# Patient Record
Sex: Female | Born: 1975 | Race: Black or African American | Hispanic: No | Marital: Single | Smoking: Current every day smoker
Health system: Southern US, Community
[De-identification: ages and names within clinical notes are randomized; demographics above are authoritative.]

## PROBLEM LIST (undated history)

## (undated) DIAGNOSIS — I10 Essential (primary) hypertension: Secondary | ICD-10-CM

## (undated) HISTORY — PX: DILATION AND CURETTAGE OF UTERUS: SHX78

## (undated) HISTORY — PX: BREAST BIOPSY: SHX20

## (undated) HISTORY — PX: RENAL BIOPSY: SHX156

---

## 2014-04-03 ENCOUNTER — Encounter (HOSPITAL_BASED_OUTPATIENT_CLINIC_OR_DEPARTMENT_OTHER): Payer: Self-pay | Admitting: *Deleted

## 2014-04-03 ENCOUNTER — Emergency Department (HOSPITAL_BASED_OUTPATIENT_CLINIC_OR_DEPARTMENT_OTHER)
Admission: EM | Admit: 2014-04-03 | Discharge: 2014-04-03 | Disposition: A | Discharge status: Home / Self Care | Payer: No Typology Code available for payment source | Attending: Emergency Medicine | Admitting: Emergency Medicine

## 2014-04-03 ENCOUNTER — Emergency Department (HOSPITAL_BASED_OUTPATIENT_CLINIC_OR_DEPARTMENT_OTHER): Payer: No Typology Code available for payment source

## 2014-04-03 DIAGNOSIS — S8002XA Contusion of left knee, initial encounter: Secondary | ICD-10-CM | POA: Insufficient documentation

## 2014-04-03 DIAGNOSIS — Y9289 Other specified places as the place of occurrence of the external cause: Secondary | ICD-10-CM | POA: Insufficient documentation

## 2014-04-03 DIAGNOSIS — Y998 Other external cause status: Secondary | ICD-10-CM | POA: Insufficient documentation

## 2014-04-03 DIAGNOSIS — Z720 Tobacco use: Secondary | ICD-10-CM | POA: Insufficient documentation

## 2014-04-03 DIAGNOSIS — I10 Essential (primary) hypertension: Secondary | ICD-10-CM | POA: Insufficient documentation

## 2014-04-03 DIAGNOSIS — Y9389 Activity, other specified: Secondary | ICD-10-CM | POA: Insufficient documentation

## 2014-04-03 HISTORY — DX: Essential (primary) hypertension: I10

## 2014-04-03 MED ORDER — ACETAMINOPHEN 500 MG PO TABS
1000.0000 mg | ORAL_TABLET | Freq: Once | ORAL | Status: AC
  Administered 2014-04-03: 1000 mg via ORAL
  Filled 2014-04-03: qty 2
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
## 2014-04-03 NOTE — Discharge Instructions (Signed)

## 2014-04-03 NOTE — ED Notes (Signed)
Went to retrieve patient for x-ray exam. Patient wanted to talk to physician first before going for x-ray. Alerted Dr. Loretha StaplerWofford and he said to place exam on hold for now.
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
## 2017-08-23 ENCOUNTER — Encounter (HOSPITAL_COMMUNITY): Payer: Self-pay | Admitting: Emergency Medicine

## 2017-08-23 ENCOUNTER — Other Ambulatory Visit: Payer: Self-pay

## 2017-08-23 ENCOUNTER — Emergency Department (HOSPITAL_COMMUNITY): Payer: Self-pay

## 2017-08-23 ENCOUNTER — Emergency Department (HOSPITAL_COMMUNITY)
Admission: EM | Admit: 2017-08-23 | Discharge: 2017-08-23 | Discharge status: Against Medical Advice | Payer: Self-pay | Attending: Emergency Medicine | Admitting: Emergency Medicine

## 2017-08-23 DIAGNOSIS — R42 Dizziness and giddiness: Secondary | ICD-10-CM | POA: Insufficient documentation

## 2017-08-23 DIAGNOSIS — I10 Essential (primary) hypertension: Secondary | ICD-10-CM | POA: Insufficient documentation

## 2017-08-23 DIAGNOSIS — F17210 Nicotine dependence, cigarettes, uncomplicated: Secondary | ICD-10-CM | POA: Insufficient documentation

## 2017-08-23 LAB — BASIC METABOLIC PANEL
Anion gap: 10 (ref 5–15)
BUN: 9 mg/dL (ref 6–20)
CALCIUM: 9 mg/dL (ref 8.9–10.3)
CO2: 25 mmol/L (ref 22–32)
Chloride: 104 mmol/L (ref 101–111)
Creatinine, Ser: 0.62 mg/dL (ref 0.44–1.00)
GFR calc Af Amer: >60 mL/min (ref >60)
GFR calc non Af Amer: >60 mL/min (ref >60)
GLUCOSE: 101 mg/dL — AB (ref 65–99)
Potassium: 3.7 mmol/L (ref 3.5–5.1)
Sodium: 139 mmol/L (ref 135–145)

## 2017-08-23 LAB — CBC
HCT: 42.3 % (ref 36.0–46.0)
Hemoglobin: 14 g/dL (ref 12.0–15.0)
MCH: 32 pg (ref 26.0–34.0)
MCHC: 33.1 g/dL (ref 30.0–36.0)
MCV: 96.8 fL (ref 78.0–100.0)
PLATELETS: 370 10*3/uL (ref 150–400)
RBC: 4.37 MIL/uL (ref 3.87–5.11)
RDW: 13.7 % (ref 11.5–15.5)
WBC: 10.6 10*3/uL — ABNORMAL HIGH (ref 4.0–10.5)

## 2017-08-23 LAB — DIFFERENTIAL
Basophils Absolute: 0 10*3/uL (ref 0.0–0.1)
Basophils Relative: 0 %
EOS PCT: 1 %
Eosinophils Absolute: 0.1 10*3/uL (ref 0.0–0.7)
Lymphocytes Relative: 17 %
Lymphs Abs: 1.8 10*3/uL (ref 0.7–4.0)
MONO ABS: 0.8 10*3/uL (ref 0.1–1.0)
Monocytes Relative: 7 %
NEUTROS PCT: 75 %
Neutro Abs: 7.9 10*3/uL — ABNORMAL HIGH (ref 1.7–7.7)

## 2017-08-23 LAB — URINALYSIS, ROUTINE W REFLEX MICROSCOPIC
Bacteria, UA: NONE SEEN
Bilirubin Urine: NEGATIVE
Glucose, UA: NEGATIVE mg/dL
Hgb urine dipstick: NEGATIVE
KETONES UR: NEGATIVE mg/dL
Leukocytes, UA: NEGATIVE
Nitrite: NEGATIVE
PH: 8 (ref 5.0–8.0)
Protein, ur: 30 mg/dL — AB
SQUAMOUS EPITHELIAL / LPF: NONE SEEN
Specific Gravity, Urine: 1.015 (ref 1.005–1.030)

## 2017-08-23 LAB — HEPATIC FUNCTION PANEL
ALT: 16 U/L (ref 14–54)
AST: 23 U/L (ref 15–41)
Albumin: 4.1 g/dL (ref 3.5–5.0)
Alkaline Phosphatase: 66 U/L (ref 38–126)
Bilirubin, Direct: <0.1 mg/dL — ABNORMAL LOW (ref 0.1–0.5)
TOTAL PROTEIN: 8.3 g/dL — AB (ref 6.5–8.1)
Total Bilirubin: 0.8 mg/dL (ref 0.3–1.2)

## 2017-08-23 LAB — LIPASE, BLOOD: Lipase: 25 U/L (ref 11–51)

## 2017-08-23 LAB — CBG MONITORING, ED: Glucose-Capillary: 97 mg/dL (ref 65–99)

## 2017-08-23 LAB — TROPONIN I: Troponin I: <0.03 ng/mL (ref <0.03)

## 2017-08-23 LAB — PREGNANCY, URINE: PREG TEST UR: NEGATIVE

## 2017-08-23 MED ORDER — MECLIZINE HCL 12.5 MG PO TABS
50.0000 mg | ORAL_TABLET | Freq: Once | ORAL | Status: AC
  Administered 2017-08-23: 50 mg via ORAL
  Filled 2017-08-23: qty 4

## 2017-08-23 NOTE — ED Triage Notes (Signed)
Patient c/o sudden dizziness that started approx an hour and a half ago. Per patient nausea and vomiting. Patient states dizziness worse with standing, "feels like room is spinning." Patient reports checking blood pressure at work - 206/126. Denies any weakness, facial drooping, or slurred speech. No neurological deficits noted. Per patient slight headache.

## 2017-08-23 NOTE — Discharge Instructions (Addendum)
Take your usual prescriptions as previously directed. Take your blood pressure once per day, a few days per week, either in the morning or in the evening before you go to bed.  Always sit quietly for at least 15 minutes before taking your blood pressure.  Keep a diary of your blood pressures to show your doctor at your follow up office visit.  Call your regular medical doctor today to schedule a follow up appointment in the next 2 days.  Return to the Emergency Department immediately sooner if worsening.

## 2017-08-23 NOTE — ED Provider Notes (Signed)
[facility] EMERGENCY DEPARTMENT Provider Note   CSN: [id number] Arrival date & time: 08/23/17  1146     History   Chief Complaint Chief Complaint  Patient presents with  . Dizziness    HPI Alyssa Thomas is a 42 y.o. female.  HPI Pt was seen at 1250.  Per pt, c/o sudden onset and resolution of one episode of "dizziness" that occurred PTA. Pt describes her symptoms as "everything was spinning." Was associated with N/V. Pt states she went to have her BP checked at work and it was "206/126." Pt states she "feels ok now." Denies CP/SOB, no abd pain, no diarrhea, no black or blood in stools or emesis, no fevers, no neck pain, no injury, no visual changes, no focal motor weakness, no tingling/numbness in extremities, no ataxia, no slurred speech, no facial droop.    Past Medical History:  Diagnosis Date  . Hypertension     There are no active problems to display for this patient.   Past Surgical History:  Procedure Laterality Date  . DILATION AND CURETTAGE OF UTERUS    . RENAL BIOPSY       OB History    Gravida  1   Para      Term      Preterm      AB  1   Living  0     SAB      TAB      Ectopic  1   Multiple      Live Births               Home Medications    Prior to Admission medications   Not on File    Family History Family History  Problem Relation Age of Onset  . Diabetes Mother   . Rheum arthritis Mother   . Hypertension Mother   . Cancer Father     Social History Social History   Tobacco Use  . Smoking status: Current Every Day Smoker    Packs/day: 1.00    Years: 15.00    Pack years: 15.00    Types: Cigarettes  . Smokeless tobacco: Never Used  Substance Use Topics  . Alcohol use: Yes    Comment: occasional  . Drug use: No     Allergies   Sulfa antibiotics   Review of Systems Review of Systems ROS: Statement: All systems negative except as marked or noted in the HPI; Constitutional: Negative for fever and  chills. ; ; Eyes: Negative for eye pain, redness and discharge. ; ; ENMT: Negative for ear pain, hoarseness, nasal congestion, sinus pressure and sore throat. ; ; Cardiovascular: Negative for chest pain, palpitations, diaphoresis, dyspnea and peripheral edema. ; ; Respiratory: Negative for cough, wheezing and stridor. ; ; Gastrointestinal: +N/V. Negative for diarrhea, abdominal pain, blood in stool, hematemesis, jaundice and rectal bleeding. . ; ; Genitourinary: Negative for dysuria, flank pain and hematuria. ; ; Musculoskeletal: Negative for back pain and neck pain. Negative for swelling and trauma.; ; Skin: Negative for pruritus, rash, abrasions, blisters, bruising and skin lesion.; ; Neuro: +"spinning." Negative for headache, lightheadedness and neck stiffness. Negative for weakness, altered level of consciousness, altered mental status, extremity weakness, paresthesias, involuntary movement, seizure and syncope.       Physical Exam Updated Vital Signs BP (!) 190/107   Pulse 85   Temp 98.9 F (37.2 C)   Resp 15   Ht 5\' 6"  (1.676 m)   Wt 86.2 kg (190 lb)  LMP 08/23/2017   SpO2 97%   BMI 30.67 kg/m   BP (!) 195/92 (BP Location: Left Arm)   Pulse 95   Temp 98.9 F (37.2 C)   Resp 16   Ht 5\' 6"  (1.676 m)   Wt 86.2 kg (190 lb)   LMP 08/23/2017   SpO2 98%   BMI 30.67 kg/m   Physical Exam 1255: Physical examination:  Nursing notes reviewed; Vital signs and O2 SAT reviewed;  Constitutional: Well developed, Well nourished, Well hydrated, In no acute distress; Head:  Normocephalic, atraumatic; Eyes: EOMI, PERRL, No scleral icterus; ENMT: TM's clear bilat. +edemetous nasal turbinates bilat with clear rhinorrhea. Mouth and pharynx normal, Mucous membranes moist; Neck: Supple, Full range of motion, No lymphadenopathy; Cardiovascular: Regular rate and rhythm, No gallop; Respiratory: Breath sounds clear & equal bilaterally, No wheezes.  Speaking full sentences with ease, Normal respiratory  effort/excursion; Chest: Nontender, Movement normal; Abdomen: Soft, Nontender, Nondistended, Normal bowel sounds; Genitourinary: No CVA tenderness; Extremities: Peripheral pulses normal, No tenderness, No edema, No calf edema or asymmetry.; Neuro: AA&Ox3, Major CN grossly intact. Speech clear.  No facial droop.  No nystagmus. Grips equal. Strength 5/5 equal bilat UE's and LE's.  DTR 2/4 equal bilat UE's and LE's.  No gross sensory deficits.  Normal cerebellar testing bilat UE's (finger-nose) and LE's (heel-shin)..; Skin: Color normal, Warm, Dry.   ED Treatments / Results  Labs (all labs ordered are listed, but only abnormal results are displayed)   EKG EKG Interpretation  Date/Time:  Thursday August 23 2017 12:10:54 EDT Ventricular Rate:  90 PR Interval:    QRS Duration: 158 QT Interval:  443 QTC Calculation: 543 R Axis:   -39 Text Interpretation:  Sinus rhythm Biatrial enlargement Left bundle branch block No previous ECGs available Confirmed by Vanetta Mulders [phone]) on 08/23/2017 12:14:35 PM   Radiology   Procedures Procedures (including critical care time)  Medications Ordered in ED Medications  meclizine (ANTIVERT) tablet 50 mg (50 mg Oral Given 08/23/17 1301)     Initial Impression / Assessment and Plan / ED Course  I have reviewed the triage vital signs and the nursing notes.  Pertinent labs & imaging results that were available during my care of the patient were reviewed by me and considered in my medical decision making (see chart for details).  MDM Reviewed: previous chart, vitals and nursing note Reviewed previous: labs and ECG Interpretation: labs and ECG   Results for orders placed or performed during the hospital encounter of 08/23/17  Basic metabolic panel  Result Value Ref Range   Sodium 139 135 - 145 mmol/L   Potassium 3.7 3.5 - 5.1 mmol/L   Chloride 104 101 - 111 mmol/L   CO2 25 22 - 32 mmol/L   Glucose, Bld 101 (H) 65 - 99 mg/dL   BUN 9 6 - 20 mg/dL     Creatinine, Ser 4.09 0.44 - 1.00 mg/dL   Calcium 9.0 8.9 - 81.1 mg/dL   GFR calc non Af Amer >60 >60 mL/min   GFR calc Af Amer >60 >60 mL/min   Anion gap 10 5 - 15  CBC  Result Value Ref Range   WBC 10.6 (H) 4.0 - 10.5 K/uL   RBC 4.37 3.87 - 5.11 MIL/uL   Hemoglobin 14.0 12.0 - 15.0 g/dL   HCT 91.4 78.2 - 95.6 %   MCV 96.8 78.0 - 100.0 fL   MCH 32.0 26.0 - 34.0 pg   MCHC 33.1 30.0 - 36.0 g/dL  RDW 13.7 11.5 - 15.5 %   Platelets 370 150 - 400 K/uL  Pregnancy, urine  Result Value Ref Range   Preg Test, Ur NEGATIVE NEGATIVE  Differential  Result Value Ref Range   Neutrophils Relative % 75 %   Neutro Abs 7.9 (H) 1.7 - 7.7 K/uL   Lymphocytes Relative 17 %   Lymphs Abs 1.8 0.7 - 4.0 K/uL   Monocytes Relative 7 %   Monocytes Absolute 0.8 0.1 - 1.0 K/uL   Eosinophils Relative 1 %   Eosinophils Absolute 0.1 0.0 - 0.7 K/uL   Basophils Relative 0 %   Basophils Absolute 0.0 0.0 - 0.1 K/uL  CBG monitoring, ED  Result Value Ref Range   Glucose-Capillary 97 65 - 99 mg/dL    5643:  BP's per previous on file. Pt states she has metal clips in her hair and is not going to remove them for any imaging study. Risks/benefits/reasoning of imaging study discussed with pt, pt verb understanding and refuses to take hair clips out. Pt states she "doesn't think I had a stroke and don't need all of this stuff done." Pt then stated she wanted to leave now.  I encouraged pt to stay, continues to refuse.  Pt makes her own medical decisions.  Risks of AMA explained to pt, including, but not limited to:  stroke, heart attack, cardiac arrythmia ("irregular heart rate/beat"), "passing out," temporary and/or permanent disability, death.  Pt verb understanding and continue to refuse any further testing in the ED, understanding the consequences of her decision.  I encouraged pt to follow up with her PMD tomorrow and return to the ED immediately if symptoms return, or for any other concerns.  Pt verb understanding,  agreeable.    Final Clinical Impressions(s) / ED Diagnoses   Final diagnoses:  None    ED Discharge Orders    None       Samuel Jester, DO 08/27/17 0800

## 2017-11-20 DIAGNOSIS — I10 Essential (primary) hypertension: Secondary | ICD-10-CM | POA: Diagnosis not present

## 2017-11-20 DIAGNOSIS — F17200 Nicotine dependence, unspecified, uncomplicated: Secondary | ICD-10-CM | POA: Diagnosis not present

## 2017-11-20 DIAGNOSIS — Z1389 Encounter for screening for other disorder: Secondary | ICD-10-CM | POA: Diagnosis not present

## 2017-11-20 DIAGNOSIS — Z6831 Body mass index (BMI) 31.0-31.9, adult: Secondary | ICD-10-CM | POA: Diagnosis not present

## 2017-11-20 DIAGNOSIS — R9431 Abnormal electrocardiogram [ECG] [EKG]: Secondary | ICD-10-CM | POA: Diagnosis not present

## 2017-11-30 ENCOUNTER — Telehealth: Payer: Self-pay

## 2017-11-30 NOTE — Telephone Encounter (Signed)
SENT REFERRAL TO SCHEDULING AND FILED NOTES 

## 2017-12-25 DIAGNOSIS — R0683 Snoring: Secondary | ICD-10-CM | POA: Diagnosis not present

## 2017-12-25 DIAGNOSIS — R011 Cardiac murmur, unspecified: Secondary | ICD-10-CM | POA: Diagnosis not present

## 2017-12-25 DIAGNOSIS — Z720 Tobacco use: Secondary | ICD-10-CM | POA: Diagnosis not present

## 2017-12-25 DIAGNOSIS — I10 Essential (primary) hypertension: Secondary | ICD-10-CM | POA: Diagnosis not present

## 2017-12-25 DIAGNOSIS — Z124 Encounter for screening for malignant neoplasm of cervix: Secondary | ICD-10-CM | POA: Diagnosis not present

## 2017-12-25 DIAGNOSIS — Z0001 Encounter for general adult medical examination with abnormal findings: Secondary | ICD-10-CM | POA: Diagnosis not present

## 2017-12-25 DIAGNOSIS — Z1239 Encounter for other screening for malignant neoplasm of breast: Secondary | ICD-10-CM | POA: Diagnosis not present

## 2018-01-01 ENCOUNTER — Telehealth: Payer: Self-pay

## 2018-01-01 ENCOUNTER — Other Ambulatory Visit: Payer: Self-pay | Admitting: Internal Medicine

## 2018-01-01 DIAGNOSIS — Z1231 Encounter for screening mammogram for malignant neoplasm of breast: Secondary | ICD-10-CM

## 2018-01-01 NOTE — Telephone Encounter (Signed)
Referral sent to scheduling. Notes on file.  

## 2018-01-07 ENCOUNTER — Other Ambulatory Visit (HOSPITAL_COMMUNITY): Payer: Self-pay | Admitting: Internal Medicine

## 2018-01-07 DIAGNOSIS — R011 Cardiac murmur, unspecified: Secondary | ICD-10-CM

## 2018-01-22 ENCOUNTER — Encounter (HOSPITAL_COMMUNITY): Payer: Self-pay | Admitting: Radiology

## 2018-01-30 ENCOUNTER — Ambulatory Visit: Payer: Self-pay

## 2018-02-05 ENCOUNTER — Ambulatory Visit
Admission: RE | Admit: 2018-02-05 | Discharge: 2018-02-05 | Disposition: A | Discharge status: Home / Self Care | Payer: BLUE CROSS/BLUE SHIELD | Source: Ambulatory Visit | Attending: Internal Medicine | Admitting: Internal Medicine

## 2018-02-05 DIAGNOSIS — Z1231 Encounter for screening mammogram for malignant neoplasm of breast: Secondary | ICD-10-CM | POA: Diagnosis not present

## 2019-01-02 ENCOUNTER — Encounter: Payer: Self-pay | Admitting: Internal Medicine

## 2019-07-26 ENCOUNTER — Ambulatory Visit: Payer: Self-pay | Attending: Internal Medicine

## 2019-07-26 DIAGNOSIS — Z23 Encounter for immunization: Secondary | ICD-10-CM | POA: Insufficient documentation

## 2019-07-26 NOTE — Progress Notes (Signed)
   Covid-19 Vaccination Clinic  Name:  Alyssa Thomas    MRN: [id number] DOB: 08-12-1975  07/26/2019  [name] was observed post Covid-19 immunization for 15 minutes without incident. She was provided with Vaccine Information Sheet and instruction to access the V-Safe system.   [name] was instructed to call 911 with any severe reactions post vaccine: Marland Kitchen Difficulty breathing  . Swelling of face and throat  . A fast heartbeat  . A bad rash all over body  . Dizziness and weakness   Immunizations Administered    Name Date Dose VIS Date Route   Pfizer COVID-19 Vaccine 07/26/2019  9:59 AM 0.3 mL 05/02/2019 Intramuscular   Manufacturer: [employer], [employer]   Lot: [id number]   NDC: [address]-6

## 2019-08-16 ENCOUNTER — Ambulatory Visit: Payer: Self-pay | Attending: Internal Medicine

## 2019-08-16 DIAGNOSIS — Z23 Encounter for immunization: Secondary | ICD-10-CM

## 2019-08-16 NOTE — Progress Notes (Signed)
   Covid-19 Vaccination Clinic  Name:  Alyssa Thomas    MRN: [id number] DOB: June 18, 1975  08/16/2019  [name] was observed post Covid-19 immunization for 15 minutes without incident. She was provided with Vaccine Information Sheet and instruction to access the V-Safe system.   [name] was instructed to call 911 with any severe reactions post vaccine: Marland Kitchen Difficulty breathing  . Swelling of face and throat  . A fast heartbeat  . A bad rash all over body  . Dizziness and weakness   Immunizations Administered    Name Date Dose VIS Date Route   Pfizer COVID-19 Vaccine 08/16/2019 10:51 AM 0.3 mL 05/02/2019 Intramuscular   Manufacturer: [employer], [employer]   Lot: [id number]   NDC: [address]-4

## 2020-02-07 IMAGING — MG DIGITAL SCREENING BILATERAL MAMMOGRAM WITH TOMO AND CAD
8 series · 8 of 24 positions shown · non-contrast
Comparison: Previous exam(s).

CLINICAL DATA: Screening.

EXAM:
DIGITAL SCREENING BILATERAL MAMMOGRAM WITH TOMO AND CAD

[R MLO synth-2D]
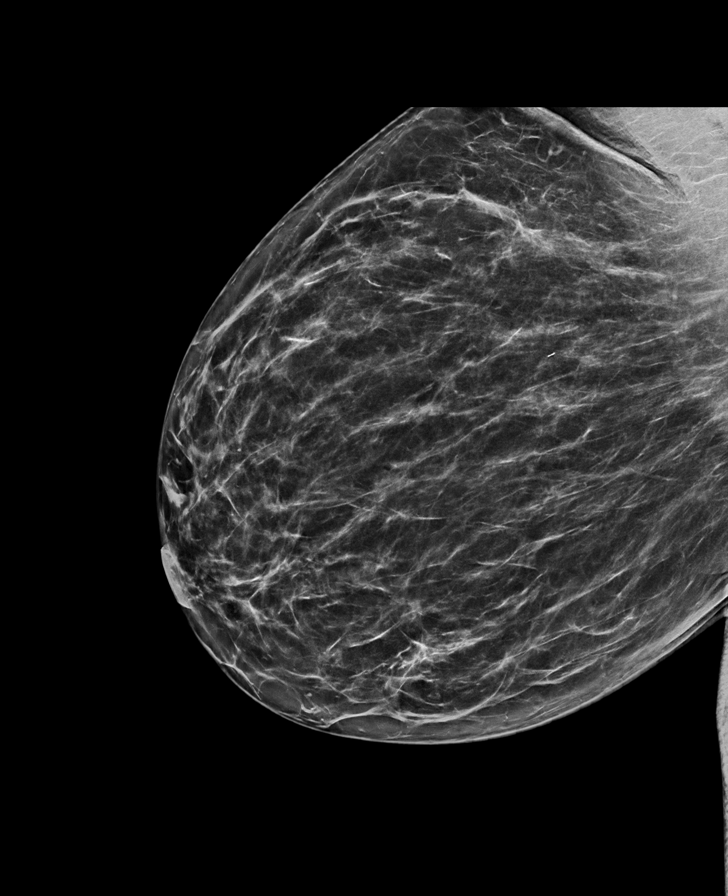

[R CC synth-2D]
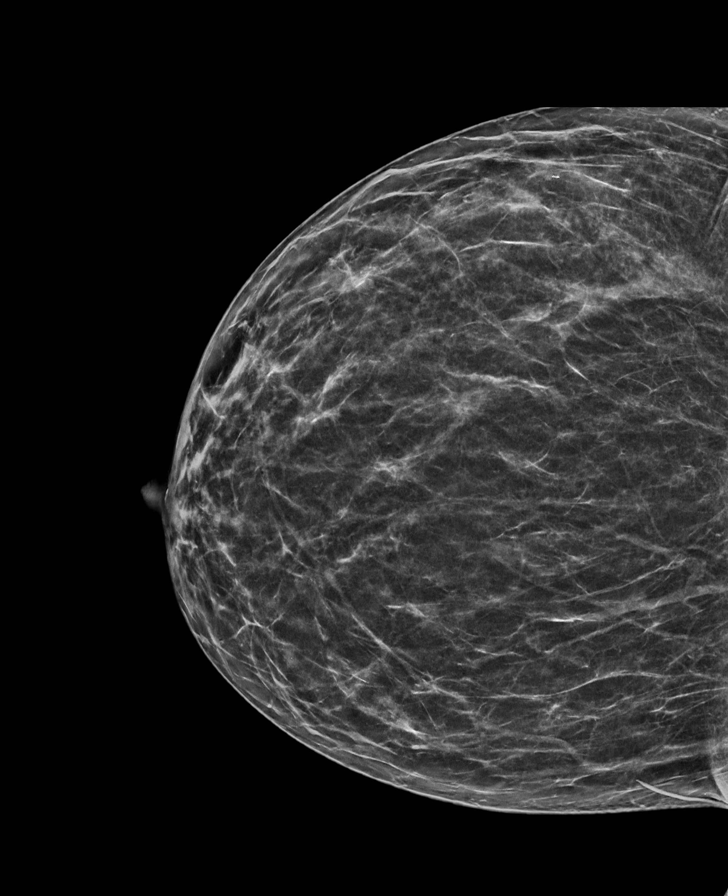

[L CC synth-2D]
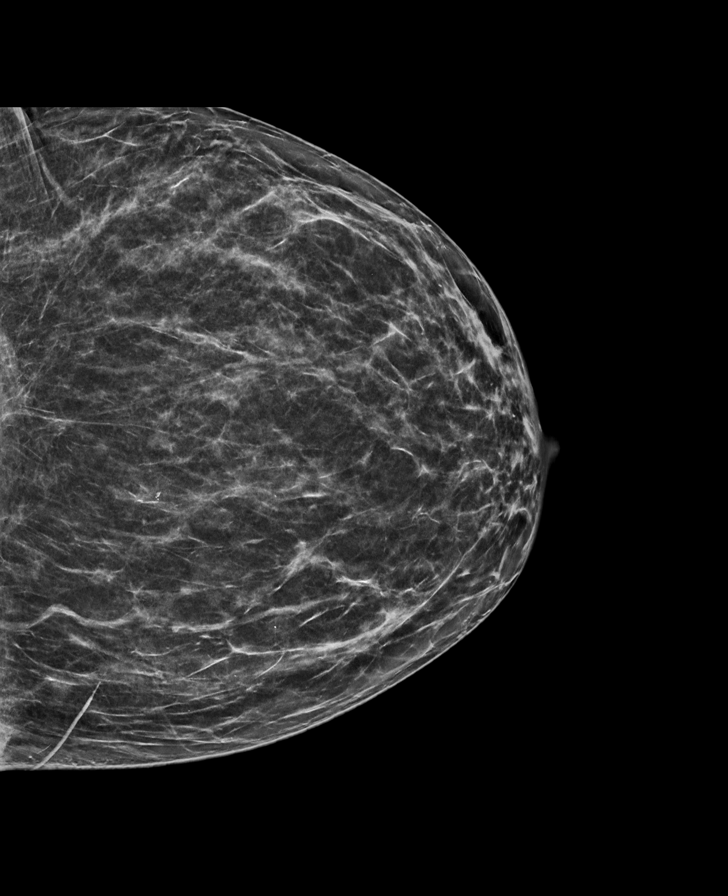

[L MLO synth-2D]
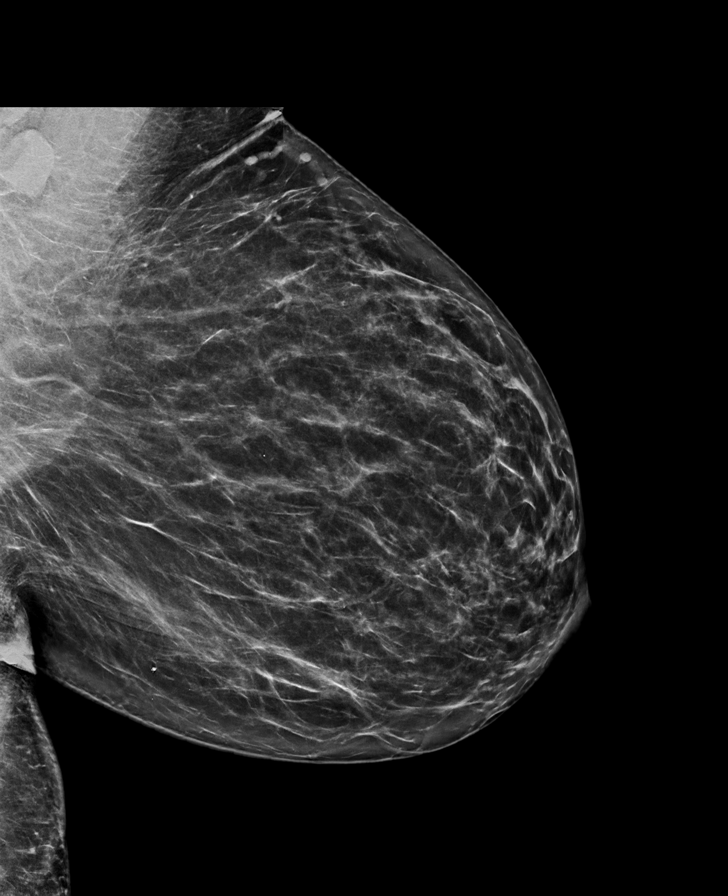

[L CC tomo · tomo slice 35/69.0]
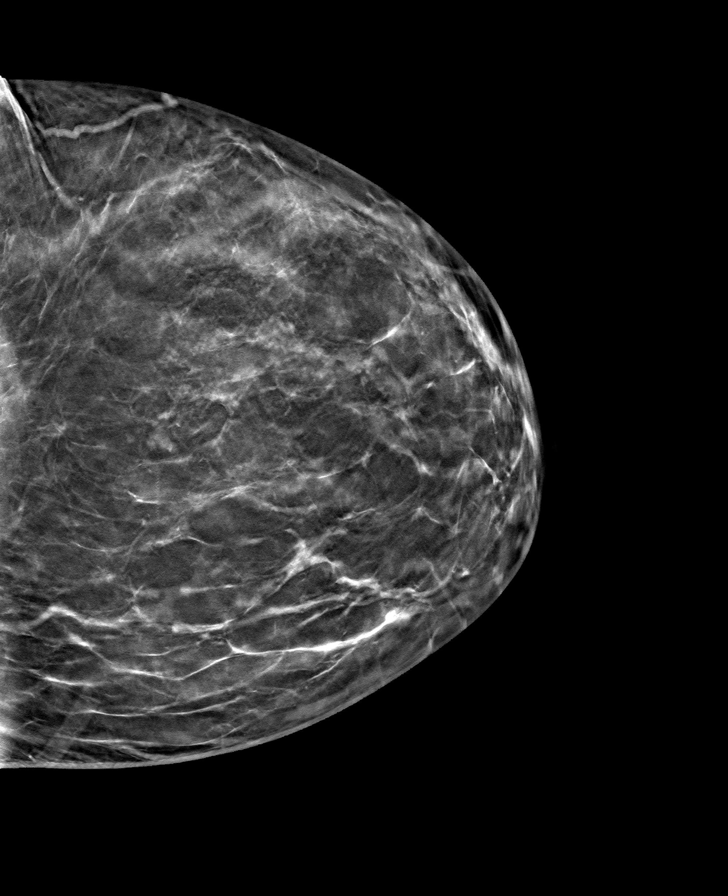

[R CC tomo · tomo slice 34/67.0]
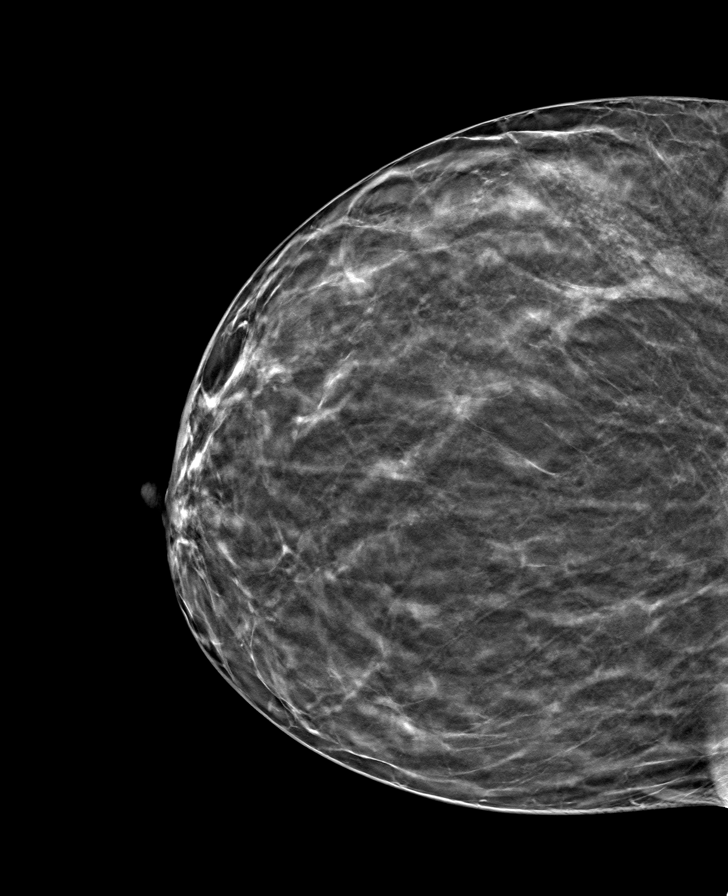

[L MLO tomo · tomo slice 41/82.0]
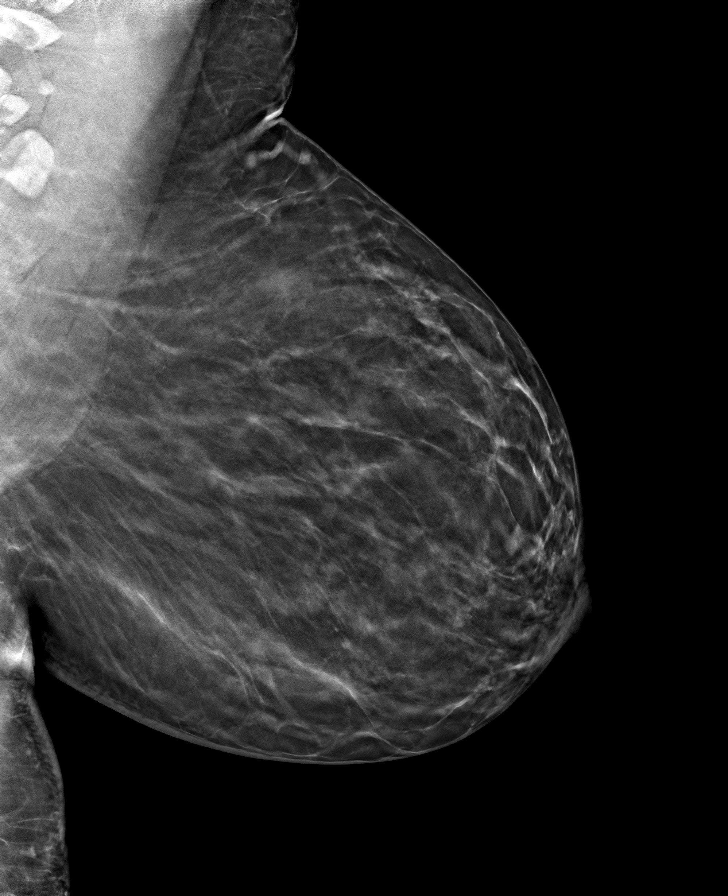

[R MLO tomo · tomo slice 41/80.0]
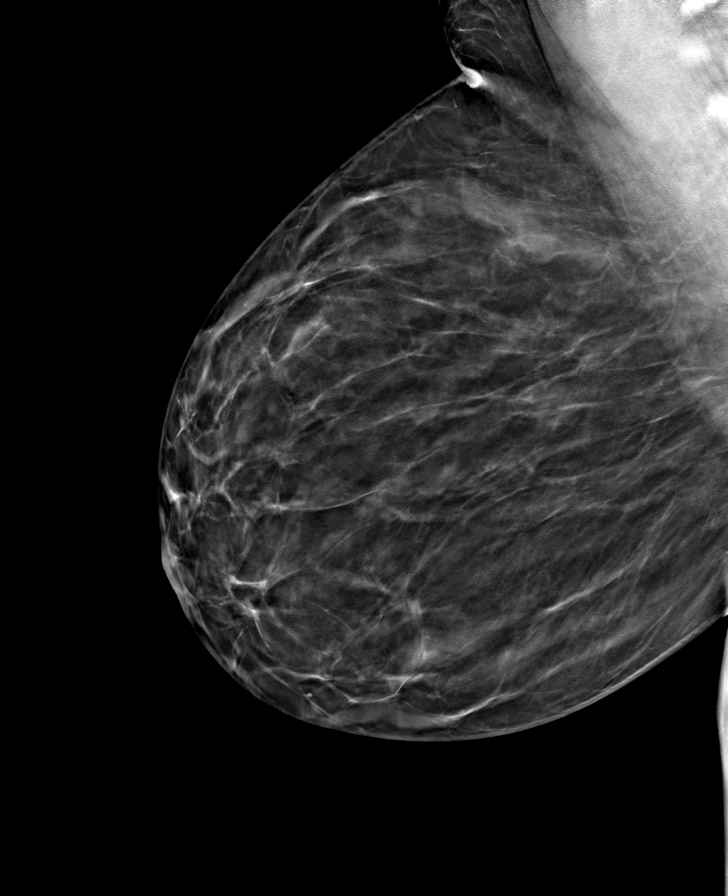

[8 of 24 positions shown; findings below may reference images not displayed]

ACR Breast Density Category b: There are scattered areas of
fibroglandular density.
FINDINGS: There are no findings suspicious for malignancy. Images were
processed with CAD.
IMPRESSION: No mammographic evidence of malignancy. A result letter of this
screening mammogram will be mailed directly to the patient.

RECOMMENDATION:
Screening mammogram in one year. (Code:[id number])

BI-RADS CATEGORY  1: Negative.
